# Patient Record
Sex: Female | Born: 1965 | Race: White | Hispanic: No | Marital: Single | Smoking: Current every day smoker
Health system: Southern US, Community
[De-identification: ages and names within clinical notes are randomized; demographics above are authoritative.]

## PROBLEM LIST (undated history)

## (undated) DIAGNOSIS — E7800 Pure hypercholesterolemia, unspecified: Secondary | ICD-10-CM

---

## 1997-12-03 ENCOUNTER — Other Ambulatory Visit: Admission: RE | Admit: 1997-12-03 | Discharge: 1997-12-03 | Payer: Self-pay | Admitting: Obstetrics and Gynecology

## 1998-01-14 ENCOUNTER — Ambulatory Visit (HOSPITAL_COMMUNITY): Admission: RE | Admit: 1998-01-14 | Discharge: 1998-01-14 | Payer: Self-pay | Admitting: Obstetrics and Gynecology

## 1998-06-23 ENCOUNTER — Inpatient Hospital Stay (HOSPITAL_COMMUNITY): Admission: AD | Admit: 1998-06-23 | Discharge: 1998-06-27 | Payer: Self-pay | Admitting: Obstetrics and Gynecology

## 1998-07-21 ENCOUNTER — Other Ambulatory Visit: Admission: RE | Admit: 1998-07-21 | Discharge: 1998-07-21 | Payer: Self-pay | Admitting: Obstetrics and Gynecology

## 1999-09-05 ENCOUNTER — Other Ambulatory Visit: Admission: RE | Admit: 1999-09-05 | Discharge: 1999-09-05 | Payer: Self-pay | Admitting: Obstetrics and Gynecology

## 1999-09-08 ENCOUNTER — Encounter: Payer: Self-pay | Admitting: Obstetrics and Gynecology

## 1999-09-08 ENCOUNTER — Encounter: Admission: RE | Admit: 1999-09-08 | Discharge: 1999-09-08 | Payer: Self-pay | Admitting: Obstetrics and Gynecology

## 1999-11-24 ENCOUNTER — Other Ambulatory Visit: Admission: RE | Admit: 1999-11-24 | Discharge: 1999-11-24 | Payer: Self-pay | Admitting: Obstetrics and Gynecology

## 1999-11-24 ENCOUNTER — Encounter (INDEPENDENT_AMBULATORY_CARE_PROVIDER_SITE_OTHER): Payer: Self-pay | Admitting: Specialist

## 1999-12-07 ENCOUNTER — Encounter: Admission: RE | Admit: 1999-12-07 | Discharge: 1999-12-07 | Payer: Self-pay | Admitting: Obstetrics and Gynecology

## 1999-12-07 ENCOUNTER — Encounter: Payer: Self-pay | Admitting: Obstetrics and Gynecology

## 2000-07-02 ENCOUNTER — Emergency Department (HOSPITAL_COMMUNITY): Admission: EM | Admit: 2000-07-02 | Discharge: 2000-07-02 | Payer: Self-pay | Admitting: Emergency Medicine

## 2000-07-02 ENCOUNTER — Encounter: Payer: Self-pay | Admitting: Emergency Medicine

## 2005-08-16 ENCOUNTER — Encounter: Payer: Self-pay | Admitting: Obstetrics and Gynecology

## 2008-07-29 ENCOUNTER — Ambulatory Visit (HOSPITAL_BASED_OUTPATIENT_CLINIC_OR_DEPARTMENT_OTHER): Admission: RE | Admit: 2008-07-29 | Discharge: 2008-07-29 | Payer: Self-pay | Admitting: Family Medicine

## 2008-07-29 ENCOUNTER — Ambulatory Visit: Payer: Self-pay | Admitting: Diagnostic Radiology

## 2016-11-19 ENCOUNTER — Encounter (HOSPITAL_BASED_OUTPATIENT_CLINIC_OR_DEPARTMENT_OTHER): Payer: Self-pay | Admitting: *Deleted

## 2016-11-19 ENCOUNTER — Emergency Department (HOSPITAL_BASED_OUTPATIENT_CLINIC_OR_DEPARTMENT_OTHER)
Admission: EM | Admit: 2016-11-19 | Discharge: 2016-11-19 | Disposition: A | Discharge status: Home / Self Care | Payer: No Typology Code available for payment source | Attending: Emergency Medicine | Admitting: Emergency Medicine

## 2016-11-19 ENCOUNTER — Emergency Department (HOSPITAL_BASED_OUTPATIENT_CLINIC_OR_DEPARTMENT_OTHER): Payer: No Typology Code available for payment source

## 2016-11-19 DIAGNOSIS — K529 Noninfective gastroenteritis and colitis, unspecified: Secondary | ICD-10-CM

## 2016-11-19 DIAGNOSIS — R109 Unspecified abdominal pain: Secondary | ICD-10-CM | POA: Diagnosis present

## 2016-11-19 HISTORY — DX: Pure hypercholesterolemia, unspecified: E78.00

## 2016-11-19 LAB — CBC WITH DIFFERENTIAL/PLATELET
Basophils Absolute: 0 10*3/uL (ref 0.0–0.1)
Basophils Relative: 0 %
Eosinophils Absolute: 0.6 10*3/uL (ref 0.0–0.7)
Eosinophils Relative: 5 %
HCT: 45.8 % (ref 36.0–46.0)
Hemoglobin: 16 g/dL — ABNORMAL HIGH (ref 12.0–15.0)
Lymphocytes Relative: 11 %
Lymphs Abs: 1.2 10*3/uL (ref 0.7–4.0)
MCH: 31.7 pg (ref 26.0–34.0)
MCHC: 34.9 g/dL (ref 30.0–36.0)
MCV: 90.9 fL (ref 78.0–100.0)
Monocytes Absolute: 0.8 10*3/uL (ref 0.1–1.0)
Monocytes Relative: 7 %
Neutro Abs: 9 10*3/uL — ABNORMAL HIGH (ref 1.7–7.7)
Neutrophils Relative %: 77 %
Platelets: 184 10*3/uL (ref 150–400)
RBC: 5.04 MIL/uL (ref 3.87–5.11)
RDW: 13.5 % (ref 11.5–15.5)
WBC: 11.7 10*3/uL — ABNORMAL HIGH (ref 4.0–10.5)

## 2016-11-19 LAB — BASIC METABOLIC PANEL
Anion gap: 11 (ref 5–15)
BUN: 23 mg/dL — ABNORMAL HIGH (ref 6–20)
CO2: 23 mmol/L (ref 22–32)
Calcium: 9.6 mg/dL (ref 8.9–10.3)
Chloride: 106 mmol/L (ref 101–111)
Creatinine, Ser: 0.93 mg/dL (ref 0.44–1.00)
GFR calc Af Amer: >60 mL/min (ref >60)
GFR calc non Af Amer: >60 mL/min (ref >60)
Glucose, Bld: 111 mg/dL — ABNORMAL HIGH (ref 65–99)
Potassium: 4 mmol/L (ref 3.5–5.1)
Sodium: 140 mmol/L (ref 135–145)

## 2016-11-19 MED ORDER — SODIUM CHLORIDE 0.9 % IV BOLUS (SEPSIS)
1000.0000 mL | Freq: Once | INTRAVENOUS | Status: AC
  Administered 2016-11-19: 1000 mL via INTRAVENOUS

## 2016-11-19 MED ORDER — CIPROFLOXACIN HCL 500 MG PO TABS: 500.0000 mg | ORAL_TABLET | Freq: Two times a day (BID) | ORAL | 0 refills | Status: AC

## 2016-11-19 MED ORDER — PROMETHAZINE HCL 25 MG/ML IJ SOLN
12.5000 mg | Freq: Once | INTRAMUSCULAR | Status: AC
  Administered 2016-11-19: 12.5 mg via INTRAVENOUS
  Filled 2016-11-19: qty 1

## 2016-11-19 MED ORDER — IOPAMIDOL (ISOVUE-300) INJECTION 61%
100.0000 mL | Freq: Once | INTRAVENOUS | Status: AC | PRN
  Administered 2016-11-19: 100 mL via INTRAVENOUS

## 2016-11-19 MED ORDER — HYDROMORPHONE HCL 1 MG/ML IJ SOLN
0.5000 mg | Freq: Once | INTRAMUSCULAR | Status: AC
  Administered 2016-11-19: 0.5 mg via INTRAVENOUS
  Filled 2016-11-19: qty 1

## 2016-11-19 MED ORDER — ONDANSETRON HCL 4 MG/2ML IJ SOLN
4.0000 mg | Freq: Once | INTRAMUSCULAR | Status: AC
  Administered 2016-11-19: 4 mg via INTRAVENOUS
  Filled 2016-11-19: qty 2

## 2016-11-19 MED ORDER — HYDROCODONE-ACETAMINOPHEN 5-325 MG PO TABS: 1.0000 | ORAL_TABLET | Freq: Four times a day (QID) | ORAL | 0 refills | Status: AC | PRN

## 2016-11-19 MED ORDER — METRONIDAZOLE 500 MG PO TABS: ORAL_TABLET | ORAL | 0 refills | Status: AC

## 2016-11-19 NOTE — ED Provider Notes (Signed)
Care assumed from Dr. Juleen ChinaKohut at shift change. Patient presents here after developing abdominal pain and diarrhea yesterday evening. She reports an episode of bloody stool this morning. She has tenderness across the lower abdomen. White count is 11.7. She was given IV fluids and medications and is feeling somewhat better.  A CT scan was obtained which reveals inflammation of the left colon most compatible with colitis. I suspect an infectious cause. She will be treated with Cipro and Flagyl, pain medicine, and follow-up with her primary Dr.   Geoffery Lyonselo, [facility], MD 11/19/16 [phone]

## 2016-11-19 NOTE — ED Notes (Signed)
C/o pain. RN aware.

## 2016-11-19 NOTE — ED Provider Notes (Signed)
[facility]   CSN: [id number] Arrival date & time: 11/19/16  0509     History   Chief Complaint No chief complaint on file.   HPI Jodi Rose is a 51 y.o. female.  HPI   51 year old female with nausea, vomiting, diarrhea and rectal bleeding. Patient went to bed her usual state of health. She woke up at 4 AM and the sensation that she had a bowel movement. She was going to the bathroom she began having very severe abdominal pain. She began sweating, felt nauseated and subsequently vomited. She then had diarrhea. She is wiping she did notice a small amount of bright red blood. Since that time her nausea has resolved but she continues to have intermittent sharp abdominal pain. The pain is across the lower abdomen and on the left side. No appreciable exacerbating relieving factors. Since arriving to the emergency room she had to use the bathroom again and passed several dark red blood clots with little/no stool. Pain has been improving but still coming in waves. Denies hx of GIB. No blood thinners.   No past medical history on file.  There are no active problems to display for this patient.   No past surgical history on file.  OB History    No data available       Home Medications    Prior to Admission medications   Not on File    Family History No family history on file.  Social History Social History  Substance Use Topics  . Smoking status: Not on file  . Smokeless tobacco: Not on file  . Alcohol use Not on file     Allergies   Patient has no allergy information on record.   Review of Systems Review of Systems  All systems reviewed and negative, other than as noted in HPI.  Physical Exam Updated Vital Signs BP 121/81 (BP Location: Right Arm)   Pulse 70   Temp 97.8 F (36.6 C) (Oral)   Resp 20   Ht 5' 4.5" (1.638 m)   Wt 63.5 kg (140 lb)   SpO2 100%   BMI 23.66 kg/m   Physical Exam  Constitutional: She appears  well-developed and well-nourished. No distress.  HENT:  Head: Normocephalic and atraumatic.  Eyes: Conjunctivae are normal. Right eye exhibits no discharge. Left eye exhibits no discharge.  Neck: Neck supple.  Cardiovascular: Normal rate, regular rhythm and normal heart sounds.  Exam reveals no gallop and no friction rub.   No murmur heard. Pulmonary/Chest: Effort normal and breath sounds normal. No respiratory distress.  Abdominal: Soft. She exhibits no distension. There is tenderness.  Suprapubic and LLQ tenderness w/o rebound or guarding  Musculoskeletal: She exhibits no edema or tenderness.  Neurological: She is alert.  Skin: Skin is warm and dry.  Psychiatric: Her behavior is normal. Thought content normal.  anxious  Nursing note and vitals reviewed.    ED Treatments / Results  Labs (all labs ordered are listed, but only abnormal results are displayed) Labs Reviewed  CBC WITH DIFFERENTIAL/PLATELET - Abnormal; Notable for the following:       Result Value   WBC 11.7 (*)    Hemoglobin 16.0 (*)    Neutro Abs 9.0 (*)    All other components within normal limits  BASIC METABOLIC PANEL - Abnormal; Notable for the following:    Glucose, Bld 111 (*)    BUN 23 (*)    All other components within normal limits  EKG  EKG Interpretation None       Radiology No results found.  Procedures Procedures (including critical care time)  Medications Ordered in ED Medications - No data to display   Initial Impression / Assessment and Plan / ED Course  I have reviewed the triage vital signs and the nursing notes.  Pertinent labs & imaging results that were available during my care of the patient were reviewed by me and considered in my medical decision making (see chart for details).     51yF with rectal bleeding and abdominal pain. HD stable. Afebrile. Not anticoagulated. H/H ok. Does have some tenderness across lower abdomen, but no peritonitis. No further bleeding thus  far while in the ED. Care signed out to Dr Judd Lienelo. IVF and meds. Reassess. Disposition per on coming provider.   Final Clinical Impressions(s) / ED Diagnoses   Final diagnoses:  Acute colitis    New Prescriptions New Prescriptions   No medications on file     Raeford RazorKohut, Vayden Weinand, MD 11/28/16 [phone]

## 2016-11-19 NOTE — ED Notes (Signed)
MD with pt  

## 2016-11-19 NOTE — ED Notes (Signed)
Pt made aware to return if symptoms worsen or if any life threatening symptoms occur.   

## 2016-11-19 NOTE — Discharge Instructions (Signed)
Cipro and Flagyl as prescribed.  Hydrocodone as prescribed as needed for pain.  Follow up with your primary Dr. in the next week, and return to the emergency department if you develop worsening pain, worsening bleeding, high fevers, or other new and concerning symptoms.

## 2016-11-19 NOTE — ED Notes (Signed)
ED Provider at bedside. 

## 2016-11-19 NOTE — ED Triage Notes (Signed)
Pt states that she was fine when she went to bed last night. States she awoke at 4am with diarrhea and nausea. C/o general abd pain. States pain is sharp and comes "in waves" states she ate dinner out last night and had salmon.

## 2018-01-19 IMAGING — CT CT ABD-PELV W/ CM
2 of 5 series · 15 of 46 positions shown, 17 images · IV contrast (APPLIED)
Comparison: None.

CLINICAL DATA: 51-year-old with acute onset of abdominal pain.

EXAM:
CT ABDOMEN AND PELVIS WITH CONTRAST
TECHNIQUE: Multidetector CT imaging of the abdomen and pelvis was performed
using the standard protocol following bolus administration of
intravenous contrast.
CONTRAST:  100mL [id number] IOPAMIDOL ([id number]) INJECTION 61%

[Series 2: axial st · axial · 0.72mm/px · z∈[-476,-56]mm · 12 of 94 slices shown, 14 images]
[im 5/94  soft-tissue]
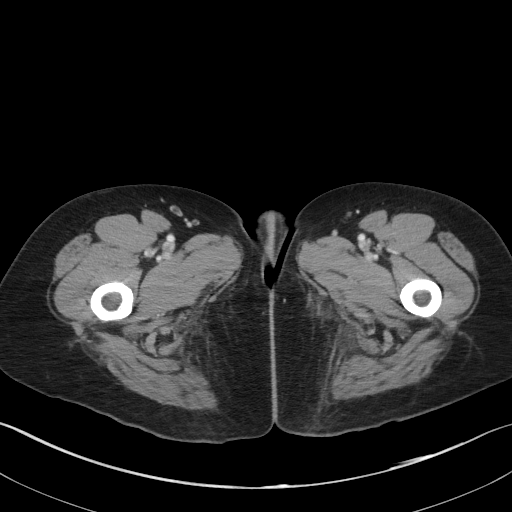
[im 5/94  bone]
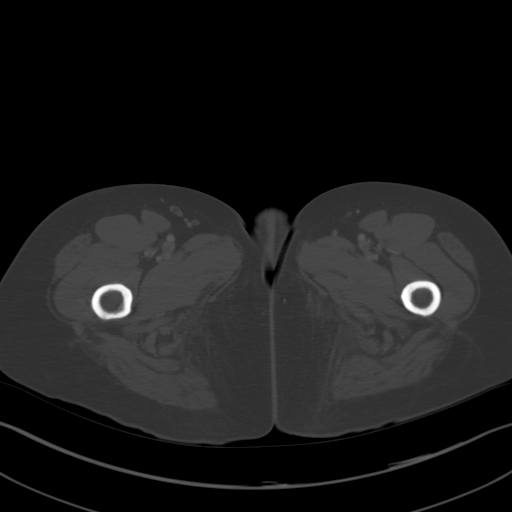
[im 14/94  soft-tissue]
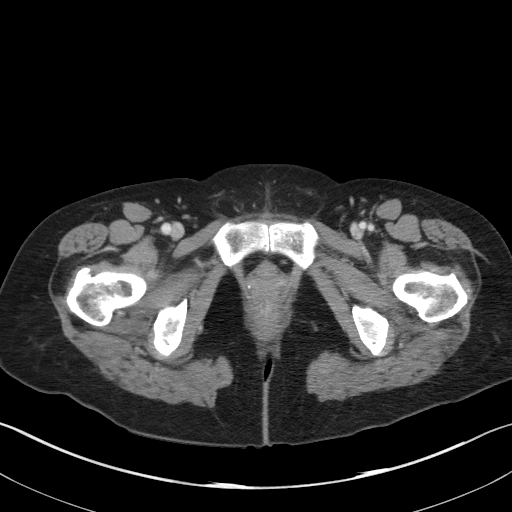
[im 19/94  soft-tissue]
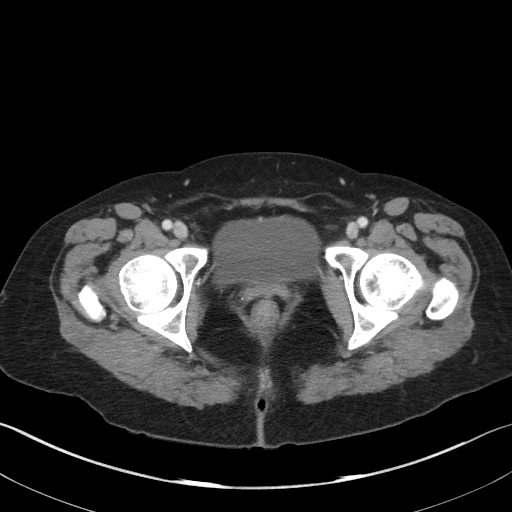
[im 28/94  soft-tissue]
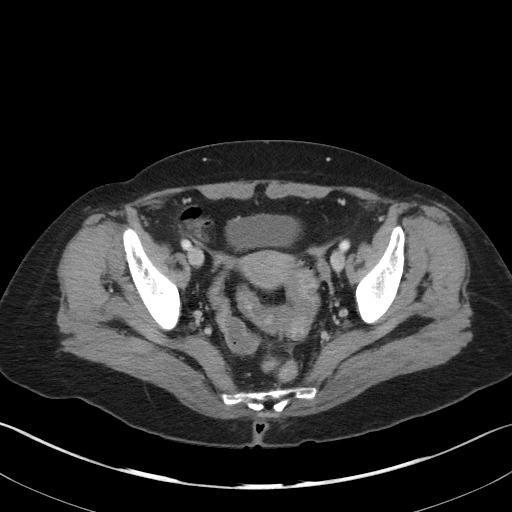
[im 38/94  soft-tissue]
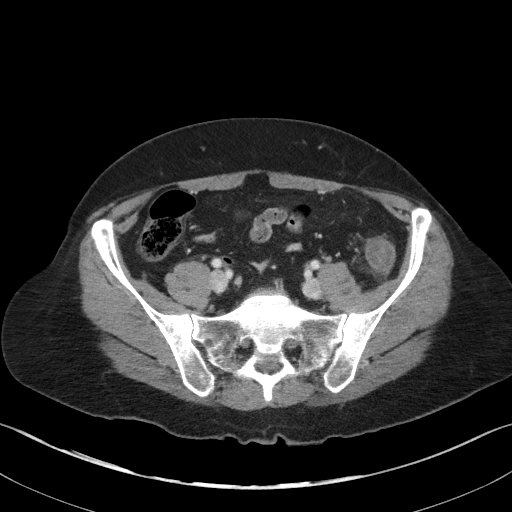
[im 42/94  soft-tissue]
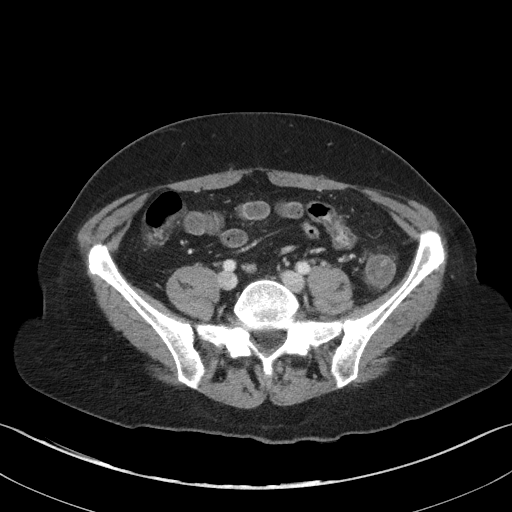
[im 52/94  soft-tissue]
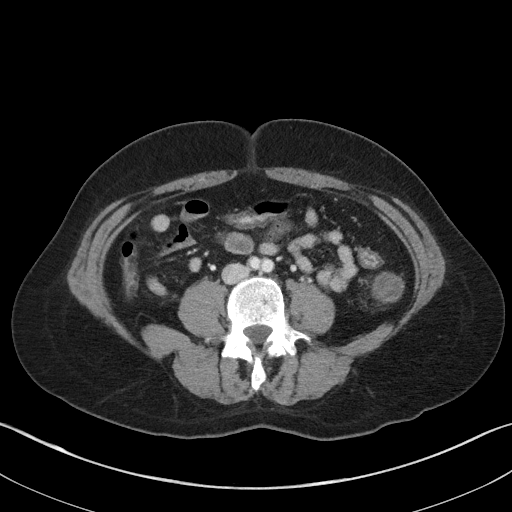
[im 56/94  soft-tissue]
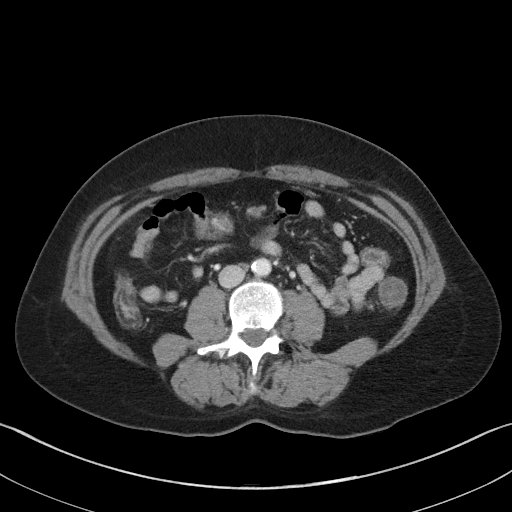
[im 66/94  soft-tissue]
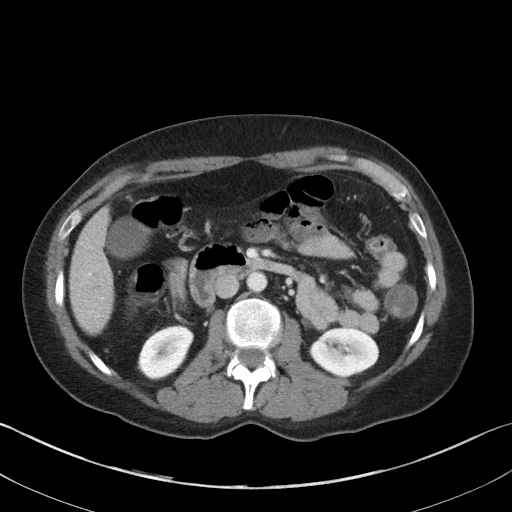
[im 66/94  bone]
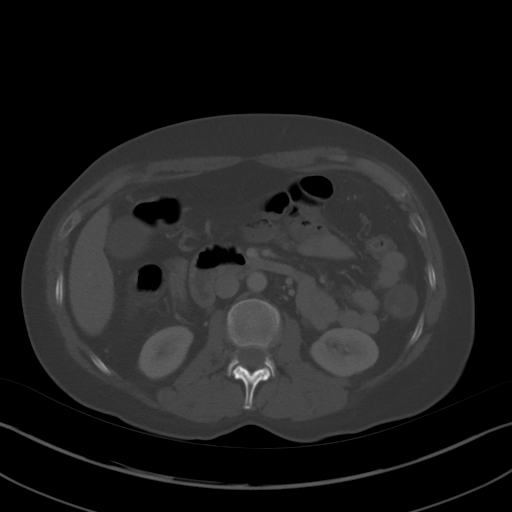
[im 75/94  soft-tissue]
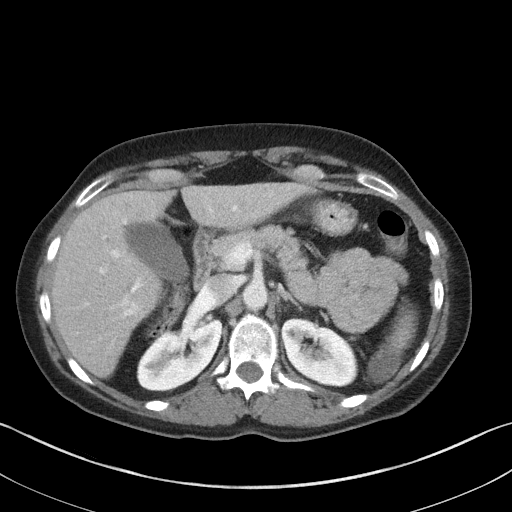
[im 80/94  soft-tissue]
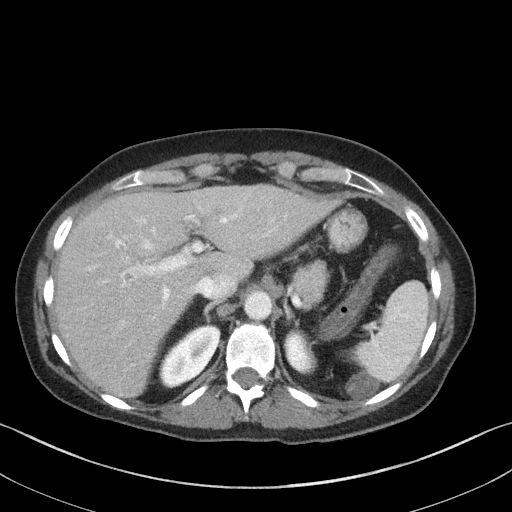
[im 89/94  soft-tissue]
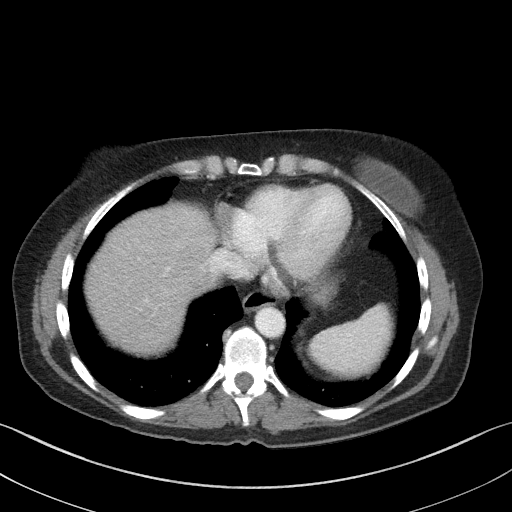

[Series 5: coronal st · coronal · 0.71mm/px · 3 of 75 slices shown]
[im 25/75  soft-tissue]
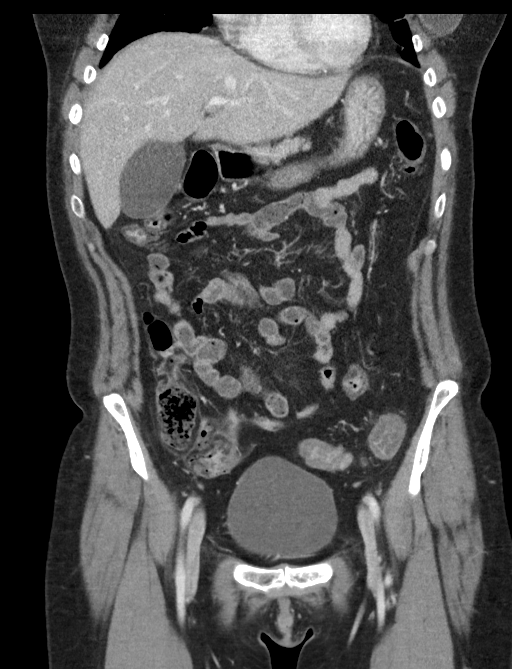
[im 33/75  soft-tissue]
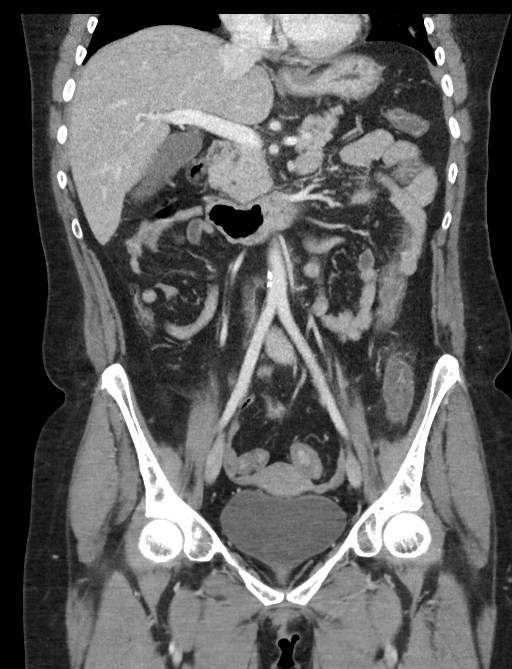
[im 42/75  soft-tissue]
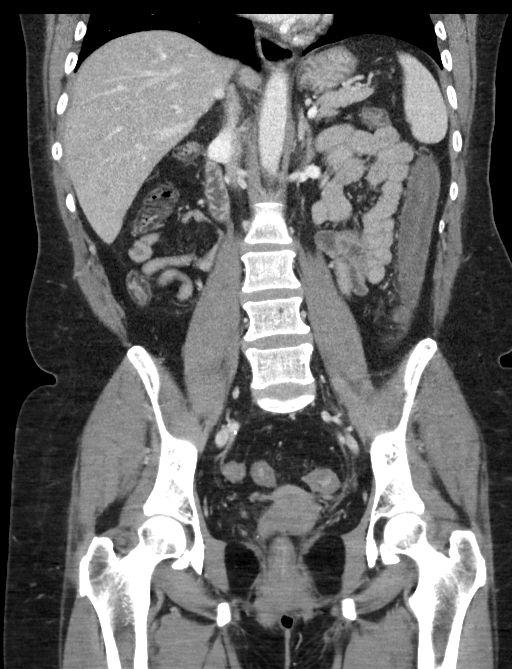

[15 of 46 positions shown; findings below may reference images not displayed]

FINDINGS: Lower chest: Bilateral breast implants. Mild atelectasis at the lung
bases. No pleural effusions.

Hepatobiliary: Punctate low-density in inferior right hepatic lobe
on sequence 2, image 25 is likely an incidental finding. Otherwise,
normal appearance of the liver. Portal venous system is patent.
Normal appearance of the gallbladder without biliary dilatation.

Pancreas: Normal appearance of the pancreas without inflammation or
duct dilatation.

Spleen: Normal appearance of spleen without enlargement.

Adrenals/Urinary Tract: Normal adrenal glands. 1 cm cyst in left
kidney upper pole. There may be an additional small cyst in left
kidney. Probable small cyst in the right kidney interpolar region.
No suspicious renal lesions. Negative for hydronephrosis. Urinary
bladder appears normal.

Stomach/Bowel: Normal appearance of the stomach and small bowel.
Diffuse wall thickening involving the descending colon from the
splenic flexure down to the sigmoid colon junction. Mild pericolonic
edema around the descending colon. Multiple diverticula in the
sigmoid colon but no inflammatory changes around a diverticulum. No
significant wall thickening involving the right colon or transverse
colon. Appendix is normal. Normal appearance of the terminal ileum.

Vascular/Lymphatic: Mild atherosclerotic disease in the abdominal
aorta without aneurysm. The main mesenteric arteries including the
IMA, SMA and celiac trunk are patent. Portal venous system is
patent. No significant lymph node enlargement in the abdomen or
pelvis.

Reproductive: Uterus and bilateral adnexa are unremarkable.

Other: Trace free fluid in the pelvis probably secondary to the left
colon inflammation.

Musculoskeletal: No acute bone abnormality.
IMPRESSION: Inflammation of the left colon most compatible with colitis. The
colitis appears to be isolated to the descending colon. Etiology for
the colitis could be infectious or inflammatory but ischemia cannot
be completely excluded. Please note that the main visceral arteries
are widely patent without significant atherosclerotic disease.

Trace free fluid probably secondary to the colitis.

Bilateral renal cysts.

Colonic diverticulosis.
